# Patient Record
Sex: Female | Born: 1950 | Hispanic: No | Marital: Married | Smoking: Never smoker
Health system: Southern US, Community
[De-identification: ages and names within clinical notes are randomized; demographics above are authoritative.]

## PROBLEM LIST (undated history)

## (undated) DIAGNOSIS — C4490 Unspecified malignant neoplasm of skin, unspecified: Secondary | ICD-10-CM

## (undated) DIAGNOSIS — N390 Urinary tract infection, site not specified: Secondary | ICD-10-CM

## (undated) DIAGNOSIS — K649 Unspecified hemorrhoids: Secondary | ICD-10-CM

## (undated) HISTORY — DX: Unspecified malignant neoplasm of skin, unspecified: C44.90

## (undated) HISTORY — PX: DENTAL SURGERY: SHX609

## (undated) HISTORY — DX: Urinary tract infection, site not specified: N39.0

## (undated) HISTORY — DX: Unspecified hemorrhoids: K64.9

## (undated) HISTORY — PX: MOHS SURGERY: SUR867

---
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
## 1974-02-19 HISTORY — PX: DILATION AND CURETTAGE OF UTERUS: SHX78

## 1999-06-08 ENCOUNTER — Other Ambulatory Visit: Admission: RE | Admit: 1999-06-08 | Discharge: 1999-06-08 | Payer: Self-pay | Admitting: Obstetrics and Gynecology

## 2000-05-01 ENCOUNTER — Other Ambulatory Visit: Admission: RE | Admit: 2000-05-01 | Discharge: 2000-05-01 | Payer: Self-pay | Admitting: Obstetrics and Gynecology

## 2000-07-01 ENCOUNTER — Other Ambulatory Visit: Admission: RE | Admit: 2000-07-01 | Discharge: 2000-07-01 | Payer: Self-pay | Admitting: Obstetrics and Gynecology

## 2001-06-10 ENCOUNTER — Other Ambulatory Visit: Admission: RE | Admit: 2001-06-10 | Discharge: 2001-06-10 | Payer: Self-pay | Admitting: Obstetrics and Gynecology

## 2003-01-25 ENCOUNTER — Other Ambulatory Visit: Admission: RE | Admit: 2003-01-25 | Discharge: 2003-01-25 | Payer: Self-pay | Admitting: Obstetrics and Gynecology

## 2004-05-30 ENCOUNTER — Other Ambulatory Visit: Admission: RE | Admit: 2004-05-30 | Discharge: 2004-05-30 | Payer: Self-pay | Admitting: Obstetrics and Gynecology

## 2015-06-16 ENCOUNTER — Encounter: Payer: Self-pay | Admitting: Internal Medicine

## 2015-07-26 ENCOUNTER — Ambulatory Visit (AMBULATORY_SURGERY_CENTER): Payer: Self-pay

## 2015-07-26 VITALS — Ht 67.0 in | Wt 136.6 lb

## 2015-07-26 DIAGNOSIS — Z1211 Encounter for screening for malignant neoplasm of colon: Secondary | ICD-10-CM

## 2015-07-26 MED ORDER — SUPREP BOWEL PREP KIT 17.5-3.13-1.6 GM/177ML PO SOLN: 1.0000 | Freq: Once | ORAL | Status: DC

## 2015-07-26 NOTE — Progress Notes (Signed)
No allergies to eggs or soy No past problems with anesthesia EXCEPT GETS TACHYCARDIC WITH EPI IF MIXED WITH NOVOCAINE No diet meds No home oxygen  Has email and internet; registered emmi

## 2015-08-03 ENCOUNTER — Encounter: Payer: Self-pay | Admitting: Internal Medicine

## 2015-08-16 ENCOUNTER — Encounter: Payer: Self-pay | Admitting: Internal Medicine

## 2015-08-16 ENCOUNTER — Ambulatory Visit (AMBULATORY_SURGERY_CENTER): Payer: BLUE CROSS/BLUE SHIELD | Admitting: Internal Medicine

## 2015-08-16 VITALS — BP 127/65 | HR 58 | Temp 98.2°F | Resp 11 | Ht 67.0 in | Wt 136.0 lb

## 2015-08-16 DIAGNOSIS — Z1211 Encounter for screening for malignant neoplasm of colon: Secondary | ICD-10-CM

## 2015-08-16 MED ORDER — SODIUM CHLORIDE 0.9 % IV SOLN: 500.0000 mL | INTRAVENOUS | Status: DC

## 2015-08-16 NOTE — Progress Notes (Signed)
To recovery, report to McCoy, RN, VSS 

## 2015-08-16 NOTE — Patient Instructions (Signed)
Discharge instructions given. Normal exam. Resume previous medications. YOU HAD AN ENDOSCOPIC PROCEDURE TODAY AT THE [facility]:   Refer to the procedure report that was given to you for any specific questions about what was found during the examination.  If the procedure report does not answer your questions, please call your gastroenterologist to clarify.  If you requested that your care partner not be given the details of your procedure findings, then the procedure report has been included in a sealed envelope for you to review at your convenience later.  YOU SHOULD EXPECT: Some feelings of bloating in the abdomen. Passage of more gas than usual.  Walking can help get rid of the air that was put into your GI tract during the procedure and reduce the bloating. If you had a lower endoscopy (such as a colonoscopy or flexible sigmoidoscopy) you may notice spotting of blood in your stool or on the toilet paper. If you underwent a bowel prep for your procedure, you may not have a normal bowel movement for a few days.  Please Note:  You might notice some irritation and congestion in your nose or some drainage.  This is from the oxygen used during your procedure.  There is no need for concern and it should clear up in a day or so.  SYMPTOMS TO REPORT IMMEDIATELY:   Following lower endoscopy (colonoscopy or flexible sigmoidoscopy):  Excessive amounts of blood in the stool  Significant tenderness or worsening of abdominal pains  Swelling of the abdomen that is new, acute  Fever of 100F or higher   For urgent or emergent issues, a gastroenterologist can be reached at any hour by calling [phone].   DIET: Your first meal following the procedure should be a small meal and then it is ok to progress to your normal diet. Heavy or fried foods are harder to digest and may make you feel nauseous or bloated.  Likewise, meals heavy in dairy and vegetables can increase bloating.  Drink plenty  of fluids but you should avoid alcoholic beverages for 24 hours.  ACTIVITY:  You should plan to take it easy for the rest of today and you should NOT DRIVE or use heavy machinery until tomorrow (because of the sedation medicines used during the test).    FOLLOW UP: Our staff will call the number listed on your records the next business day following your procedure to check on you and address any questions or concerns that you may have regarding the information given to you following your procedure. If we do not reach you, we will leave a message.  However, if you are feeling well and you are not experiencing any problems, there is no need to return our call.  We will assume that you have returned to your regular daily activities without incident.  If any biopsies were taken you will be contacted by phone or by letter within the next 1-3 weeks.  Please call us at [phone] if you have not heard about the biopsies in 3 weeks.    SIGNATURES/CONFIDENTIALITY: You and/or your care partner have signed paperwork which will be entered into your electronic medical record.  These signatures attest to the fact that that the information above on your After Visit Summary has been reviewed and is understood.  Full responsibility of the confidentiality of this discharge information lies with you and/or your care-partner. 

## 2015-08-16 NOTE — Op Note (Signed)
[facility] Patient Name: Kristy Henderson Procedure Date: 08/16/2015 11:03 AM MRN: [id number] Endoscopist: Docia Chuck. Henrene Pastor , MD Age: 65 Referring MD:  Date of Birth: August 07, 1950 Gender: Female Account #: [id number] Procedure:                Colonoscopy Indications:              Screening for colorectal malignant neoplasm Medicines:                Monitored Anesthesia Care Procedure:                Pre-Anesthesia Assessment:                           - Prior to the procedure, a History and Physical                            was performed, and patient medications and                            allergies were reviewed. The patient's tolerance of                            previous anesthesia was also reviewed. The risks                            and benefits of the procedure and the sedation                            options and risks were discussed with the patient.                            All questions were answered, and informed consent                            was obtained. Prior Anticoagulants: The patient has                            taken no previous anticoagulant or antiplatelet                            agents. ASA Grade Assessment: I - A normal, healthy                            patient. After reviewing the risks and benefits,                            the patient was deemed in satisfactory condition to                            undergo the procedure.                           After obtaining informed consent, the colonoscope  was passed under direct vision. Throughout the                            procedure, the patient's blood pressure, pulse, and                            oxygen saturations were monitored continuously. The                            Model CF-HQ190L [id number]) scope was introduced                            through the anus and advanced to the the cecum,                            identified by appendiceal orifice  and ileocecal                            valve. The ileocecal valve, appendiceal orifice,                            and rectum were photographed. The quality of the                            bowel preparation was excellent. The colonoscopy                            was performed without difficulty. The patient                            tolerated the procedure well. The bowel preparation                            used was SUPREP. Scope In: 11:10:06 AM Scope Out: 11:22:56 AM Scope Withdrawal Time: 0 hours 10 minutes 9 seconds  Total Procedure Duration: 0 hours 12 minutes 50 seconds  Findings:                 The entire examined colon appeared normal on direct                            and retroflexion views. Complications:            No immediate complications. Estimated blood loss:                            None. Estimated Blood Loss:     Estimated blood loss: none. Impression:               - The entire examined colon is normal on direct and                            retroflexion views.                           - No specimens collected. Recommendation:           -  Repeat colonoscopy in 10 years for screening                            purposes.                           - Patient has a contact number available for                            emergencies. The signs and symptoms of potential                            delayed complications were discussed with the                            patient. Return to normal activities tomorrow.                            Written discharge instructions were provided to the                            patient.                           - Resume previous diet.                           - Continue present medications. Docia Chuck. Henrene Pastor, MD 08/16/2015 11:25:22 AM This report has been signed electronically. CC Letter to:             Arvella Nigh

## 2015-08-17 ENCOUNTER — Telehealth: Payer: Self-pay | Admitting: *Deleted

## 2015-08-17 NOTE — Telephone Encounter (Signed)
  Follow up Call-  Call back number 08/16/2015  Post procedure Call Back phone  # [phone]  Permission to leave phone message Yes     Patient questions:  Do you have a fever, pain , or abdominal swelling? No. Pain Score  0 *  Have you tolerated food without any problems? Yes.    Have you been able to return to your normal activities? Yes.    Do you have any questions about your discharge instructions: Diet   No. Medications  No. Follow up visit  No.  Do you have questions or concerns about your Care? No.  Actions: * If pain score is 4 or above: No action needed, pain <4.

## 2016-11-07 ENCOUNTER — Other Ambulatory Visit: Payer: Self-pay | Admitting: Obstetrics and Gynecology

## 2016-11-07 DIAGNOSIS — R928 Other abnormal and inconclusive findings on diagnostic imaging of breast: Secondary | ICD-10-CM

## 2016-11-13 ENCOUNTER — Ambulatory Visit
Admission: RE | Admit: 2016-11-13 | Discharge: 2016-11-13 | Disposition: A | Discharge status: Home / Self Care | Payer: Medicare Other | Source: Ambulatory Visit | Attending: Obstetrics and Gynecology | Admitting: Obstetrics and Gynecology

## 2016-11-13 DIAGNOSIS — R928 Other abnormal and inconclusive findings on diagnostic imaging of breast: Secondary | ICD-10-CM

## 2018-11-10 IMAGING — MG 2D DIGITAL DIAGNOSTIC UNILATERAL LEFT MAMMOGRAM WITH CAD AND ADJ
6 series · 6 of 14 positions shown · non-contrast
Comparison: Previous exam(s).

CLINICAL DATA: 66-year-old female for further evaluation of
possible left breast mass on screening mammogram P

EXAM:
2D DIGITAL DIAGNOSTIC LEFT MAMMOGRAM WITH ADJUNCT TOMO
ULTRASOUND LEFT BREAST

[L MLO]
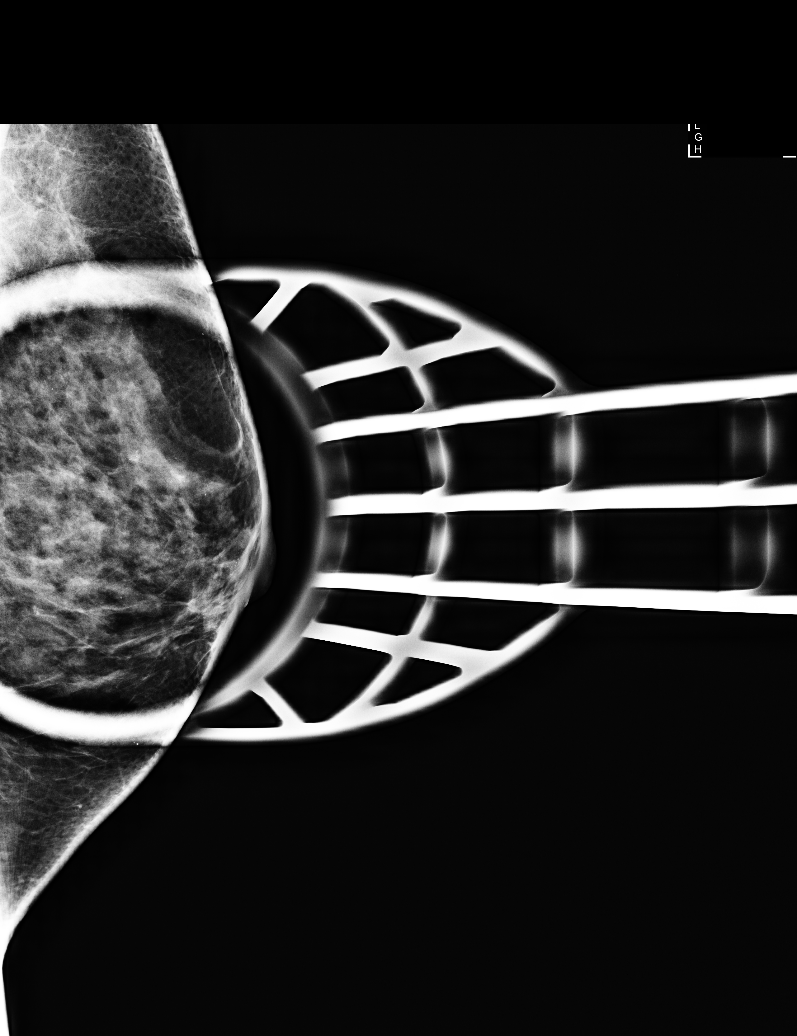

[L CC synth-2D]
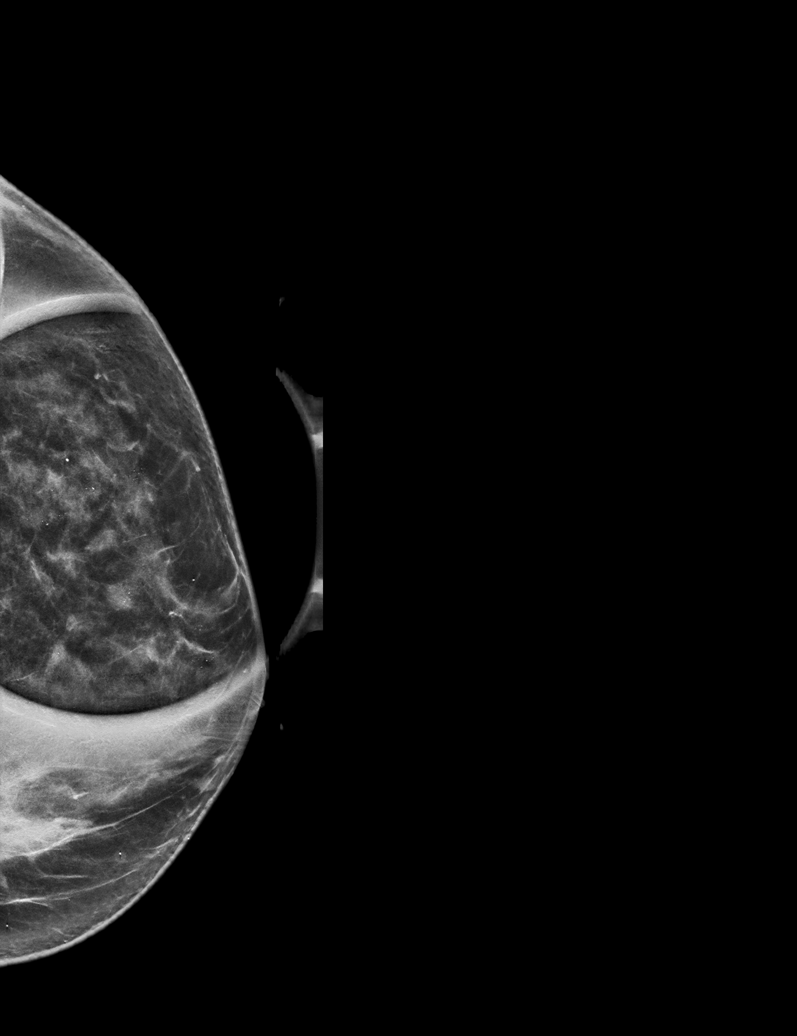

[L MLO synth-2D]
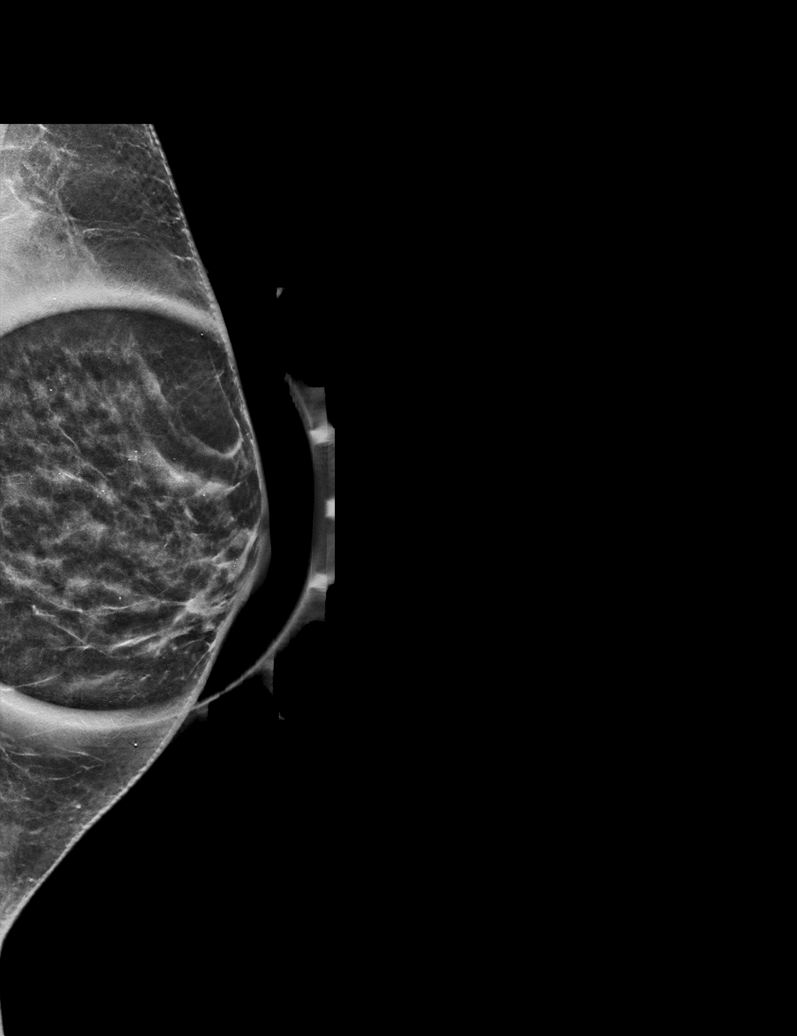

[L CC]
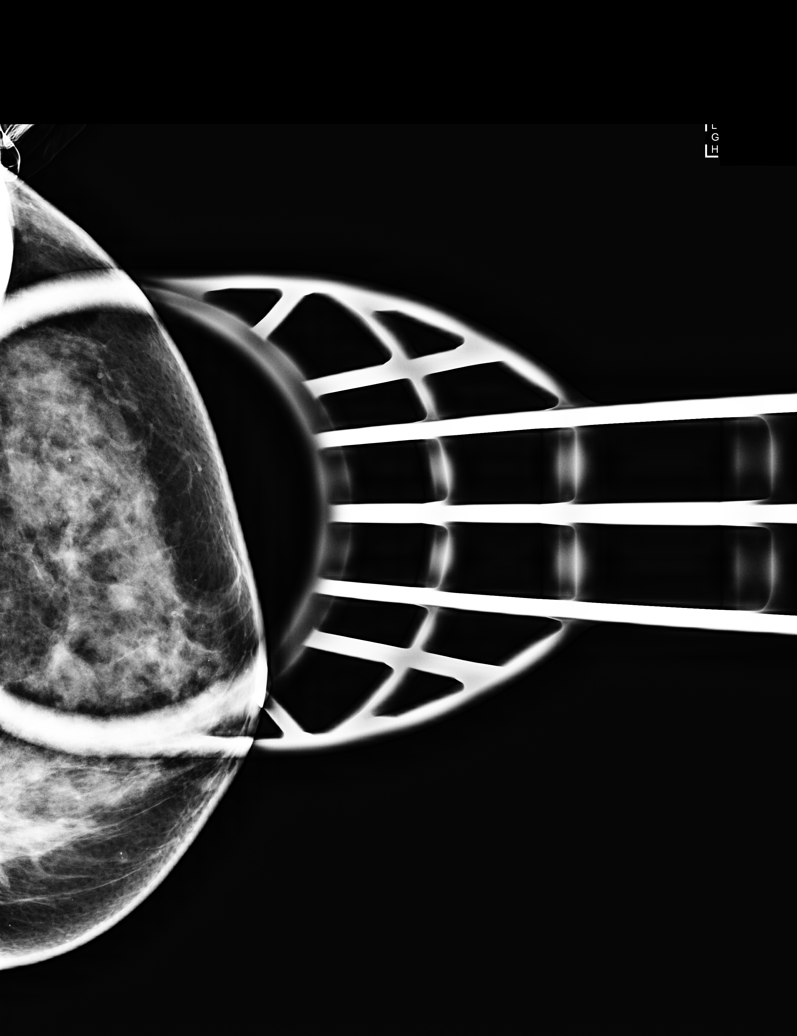

[L CC tomo · tomo slice 29/56.0]
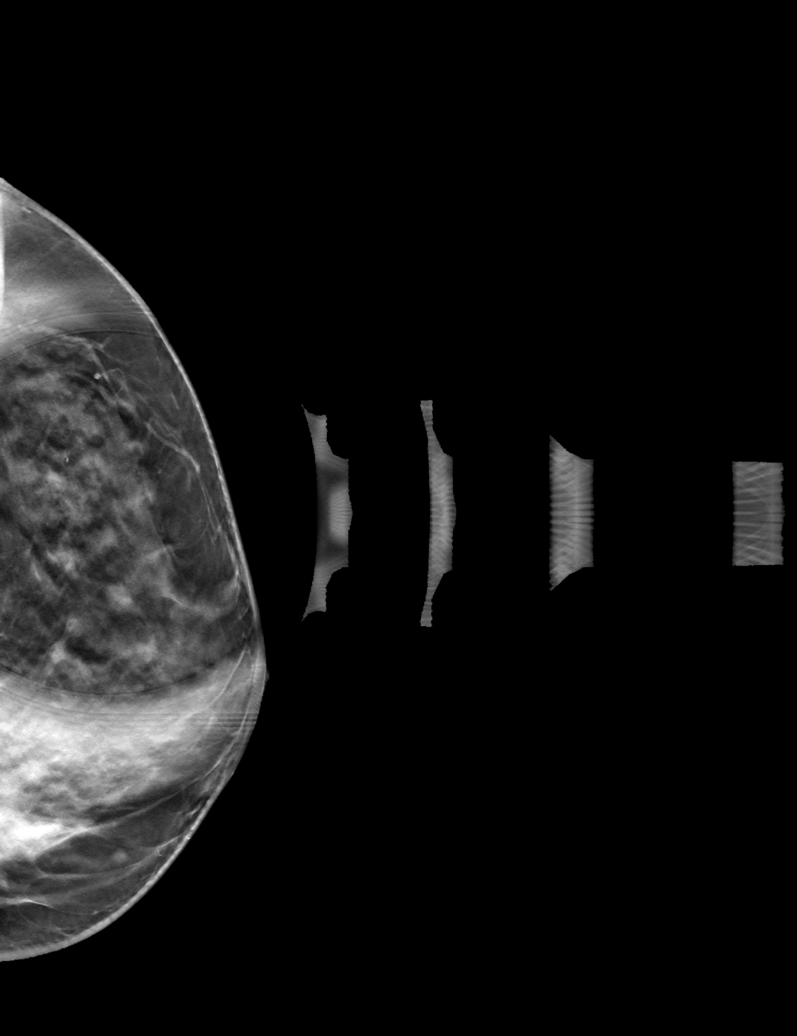

[L MLO tomo · tomo slice 27/53.0]
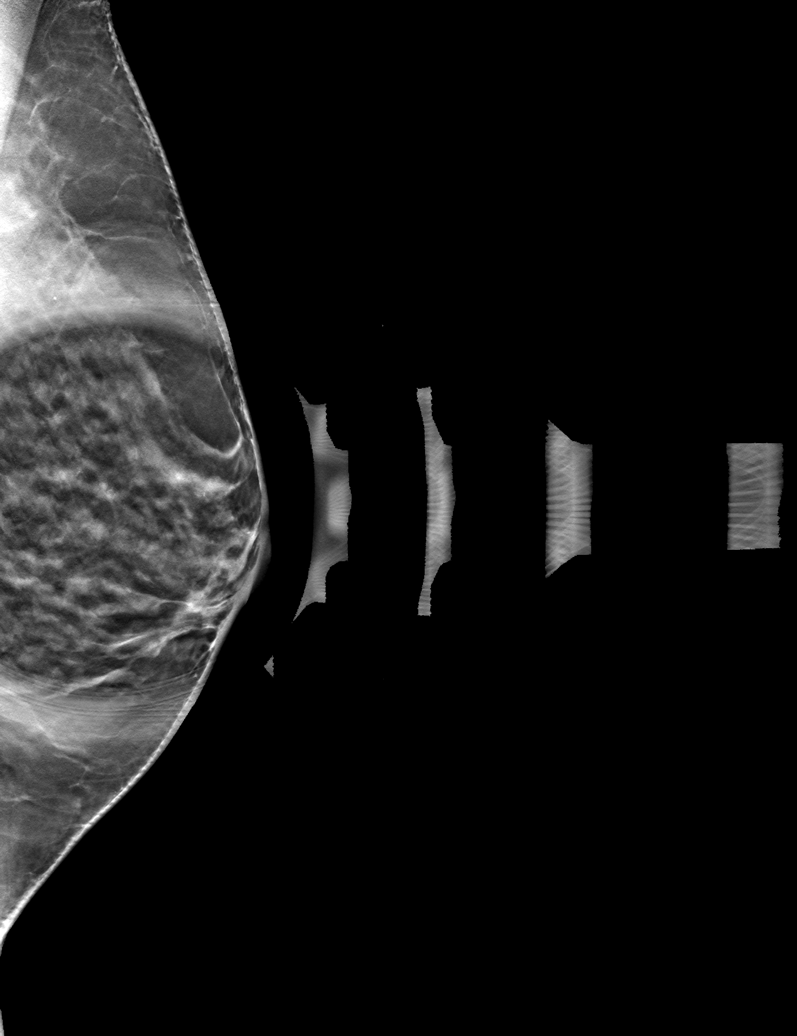

[6 of 14 positions shown; findings below may reference images not displayed]

ACR Breast Density Category c: The breast tissue is heterogeneously
dense, which may obscure small masses.
FINDINGS: 2D and 3D spot compression views of the left breast demonstrate a
circumscribed oval mass within the outer left breast.

On physical exam, no palpable abnormalities are identified.

Targeted ultrasound is performed, showing a 7 x 6 x 6 mm benign
simple cyst at the [DATE] position of the left breast 1 cm from the
nipple, corresponding to the screening study fine.
IMPRESSION: Benign cyst in the outer left breast corresponding to the screening
study finding.

RECOMMENDATION:
Bilateral screening mammograms in 1 year.

I have discussed the findings and recommendations with the patient.
Results were also provided in writing at the conclusion of the
visit. If applicable, a reminder letter will be sent to the patient
regarding the next appointment.

BI-RADS CATEGORY  2: Benign.
